# Patient Record
Sex: Male | Born: 1965 | Race: Black or African American | Hispanic: No | Marital: Married | Smoking: Never smoker
Health system: Southern US, Community
[De-identification: ages and names within clinical notes are randomized; demographics above are authoritative.]

---

## 2016-05-27 ENCOUNTER — Emergency Department (HOSPITAL_BASED_OUTPATIENT_CLINIC_OR_DEPARTMENT_OTHER)
Admission: EM | Admit: 2016-05-27 | Discharge: 2016-05-27 | Disposition: A | Discharge status: Home / Self Care | Payer: BLUE CROSS/BLUE SHIELD | Attending: Emergency Medicine | Admitting: Emergency Medicine

## 2016-05-27 ENCOUNTER — Emergency Department (HOSPITAL_BASED_OUTPATIENT_CLINIC_OR_DEPARTMENT_OTHER): Payer: BLUE CROSS/BLUE SHIELD

## 2016-05-27 ENCOUNTER — Encounter (HOSPITAL_BASED_OUTPATIENT_CLINIC_OR_DEPARTMENT_OTHER): Payer: Self-pay | Admitting: *Deleted

## 2016-05-27 DIAGNOSIS — R55 Syncope and collapse: Secondary | ICD-10-CM | POA: Diagnosis not present

## 2016-05-27 DIAGNOSIS — J111 Influenza due to unidentified influenza virus with other respiratory manifestations: Secondary | ICD-10-CM

## 2016-05-27 DIAGNOSIS — R05 Cough: Secondary | ICD-10-CM | POA: Diagnosis not present

## 2016-05-27 DIAGNOSIS — R0989 Other specified symptoms and signs involving the circulatory and respiratory systems: Secondary | ICD-10-CM | POA: Diagnosis not present

## 2016-05-27 DIAGNOSIS — Z87891 Personal history of nicotine dependence: Secondary | ICD-10-CM | POA: Insufficient documentation

## 2016-05-27 DIAGNOSIS — R0981 Nasal congestion: Secondary | ICD-10-CM | POA: Diagnosis present

## 2016-05-27 DIAGNOSIS — R69 Illness, unspecified: Secondary | ICD-10-CM

## 2016-05-27 NOTE — ED Notes (Signed)
Pt on cardiac monitor, pulse ox and automatic VS

## 2016-05-27 NOTE — ED Provider Notes (Signed)
[facility]   CSN: [id number] Arrival date & time: 05/27/16  1004     History   Chief Complaint No chief complaint on file.  Chief complaint sinus congestion, cough HPI Scott Irwin is a 51 y.o. male.  HPI Complains of  congestion in his face and cough productive of slight amount of white sputum accompanied by aunt. Chest pain which is caused by coughing onset 3 days ago. Maximum temperature 100. Denies any shortness of breath. Treated himself with Tylenol, Mucinex and Sudafed, without relief. No other associated symptoms. Nothing makes symptoms better or worse. No past medical history on file. Past medical history negative There are no active problems to display for this patient.   No past surgical history on file.     Home Medications    Prior to Admission medications   Not on File    Family History No family history on file.  Social History Social History  Substance Use Topics  . Smoking status: Not on file  . Smokeless tobacco: Not on file  . Alcohol use Not on file   Ex-smoker quit 8 months ago no illicit drug use. Drinks 3 or 4  40 ounce beers per day  Allergies   Patient has no allergy information on record.   Review of Systems Review of Systems  Constitutional: Negative.   HENT: Positive for sinus pressure.   Respiratory: Positive for cough.   Cardiovascular: Negative.        Syncope  Gastrointestinal: Negative.   Musculoskeletal: Negative.   Skin: Negative.   Allergic/Immunologic: Negative.   Neurological: Negative.   Psychiatric/Behavioral: Negative.   All other systems reviewed and are negative.    Physical Exam Updated Vital Signs BP (!) 148/109 (BP Location: Right Arm)   Pulse 82   Temp 98.2 F (36.8 C) (Oral)   Resp 18   Ht 5\' 7"  (1.702 m)   Wt 175 lb (79.4 kg)   SpO2 100%   BMI 27.41 kg/m   Physical Exam  Constitutional: He appears well-developed and well-nourished.  HENT:  Head: Normocephalic  and atraumatic.  Eyes: Conjunctivae are normal. Pupils are equal, round, and reactive to light.  Neck: Neck supple. No tracheal deviation present. No thyromegaly present.  Cardiovascular: Normal rate and regular rhythm.   No murmur heard. Pulmonary/Chest: Effort normal. No respiratory distress.  Coughing. Scant diffuse rhonchi  Abdominal: Soft. Bowel sounds are normal. He exhibits no distension. There is no tenderness.  Musculoskeletal: Normal range of motion. He exhibits no edema or tenderness.  Neurological: He is alert. Coordination normal.  Skin: Skin is warm and dry. No rash noted.  Psychiatric: He has a normal mood and affect.  Nursing note and vitals reviewed.    ED Treatments / Results  Labs (all labs ordered are listed, but only abnormal results are displayed) Labs Reviewed - No data to display  EKG  EKG Interpretation  Date/Time:  Saturday May 27 2016 10:15:09 EST Ventricular Rate:  91 PR Interval:    QRS Duration: 81 QT Interval:  340 QTC Calculation: 419 R Axis:   36 Text Interpretation:  Sinus rhythm Probable left atrial enlargement Abnormal R-wave progression, early transition No old tracing to compare Confirmed by Ethelda ChickJACUBOWITZ  MD, Jonette Wassel [phone]([id number]) on 05/27/2016 10:22:46 AM       Radiology No results found.  Procedures Procedures (including critical care time)  Medications Ordered in ED Medications - No data to display   Initial Impression / Assessment and Plan /  ED Course  I have reviewed the triage vital signs and the nursing notes.  Pertinent labs & imaging results that were available during my care of the patient were reviewed by me and considered in my medical decision making (see chart for details).  Clinical Course     Chest x-ray viewed by me. No results found for this or any previous visit. Dg Chest 2 View  Result Date: 05/27/2016 CLINICAL DATA:  Cough for 4 days.  Chest pain today. EXAM: CHEST  2 VIEW COMPARISON:  None. FINDINGS: Lungs are  clear. Heart size is normal. No pneumothorax or pleural fluid. No bony abnormality. IMPRESSION: Negative chest. Electronically Signed   By: Drusilla Kanner M.D.   On: 05/27/2016 10:49   Symptoms and exam consistent with influenza-like illness. Patient with incidental elevated blood pressure Final Clinical Impressions(s) / ED Diagnoses  Plan Tylenol for aches. Blood pressure recheck 2-3 weeks. Referral for primary care physician. Final diagnoses:  None   Diagnoses #1 influenza-like illness #2 elevated blood pressure New Prescriptions New Prescriptions   No medications on file     Doug Sou, MD 05/27/16 1104

## 2016-05-27 NOTE — Discharge Instructions (Signed)
Take Tylenol every 4 hours for aches or for temperature higher than 100.4. Take Robitussin as record for cough. See an urgent care if not better in a week or return if condition worsens for any reason or if concerned. Call the 800 number on your discharge instructions to arrange to get a primary care physician. Your blood pressure should be rechecked within the next one or two weeks. Today's was elevated at 148/109.

## 2018-01-22 IMAGING — DX DG CHEST 2V
2 series · 2 of 2 positions shown · non-contrast
Comparison: None.

CLINICAL DATA: Cough for 4 days.  Chest pain today.

EXAM:
CHEST  2 VIEW

[chest pa]
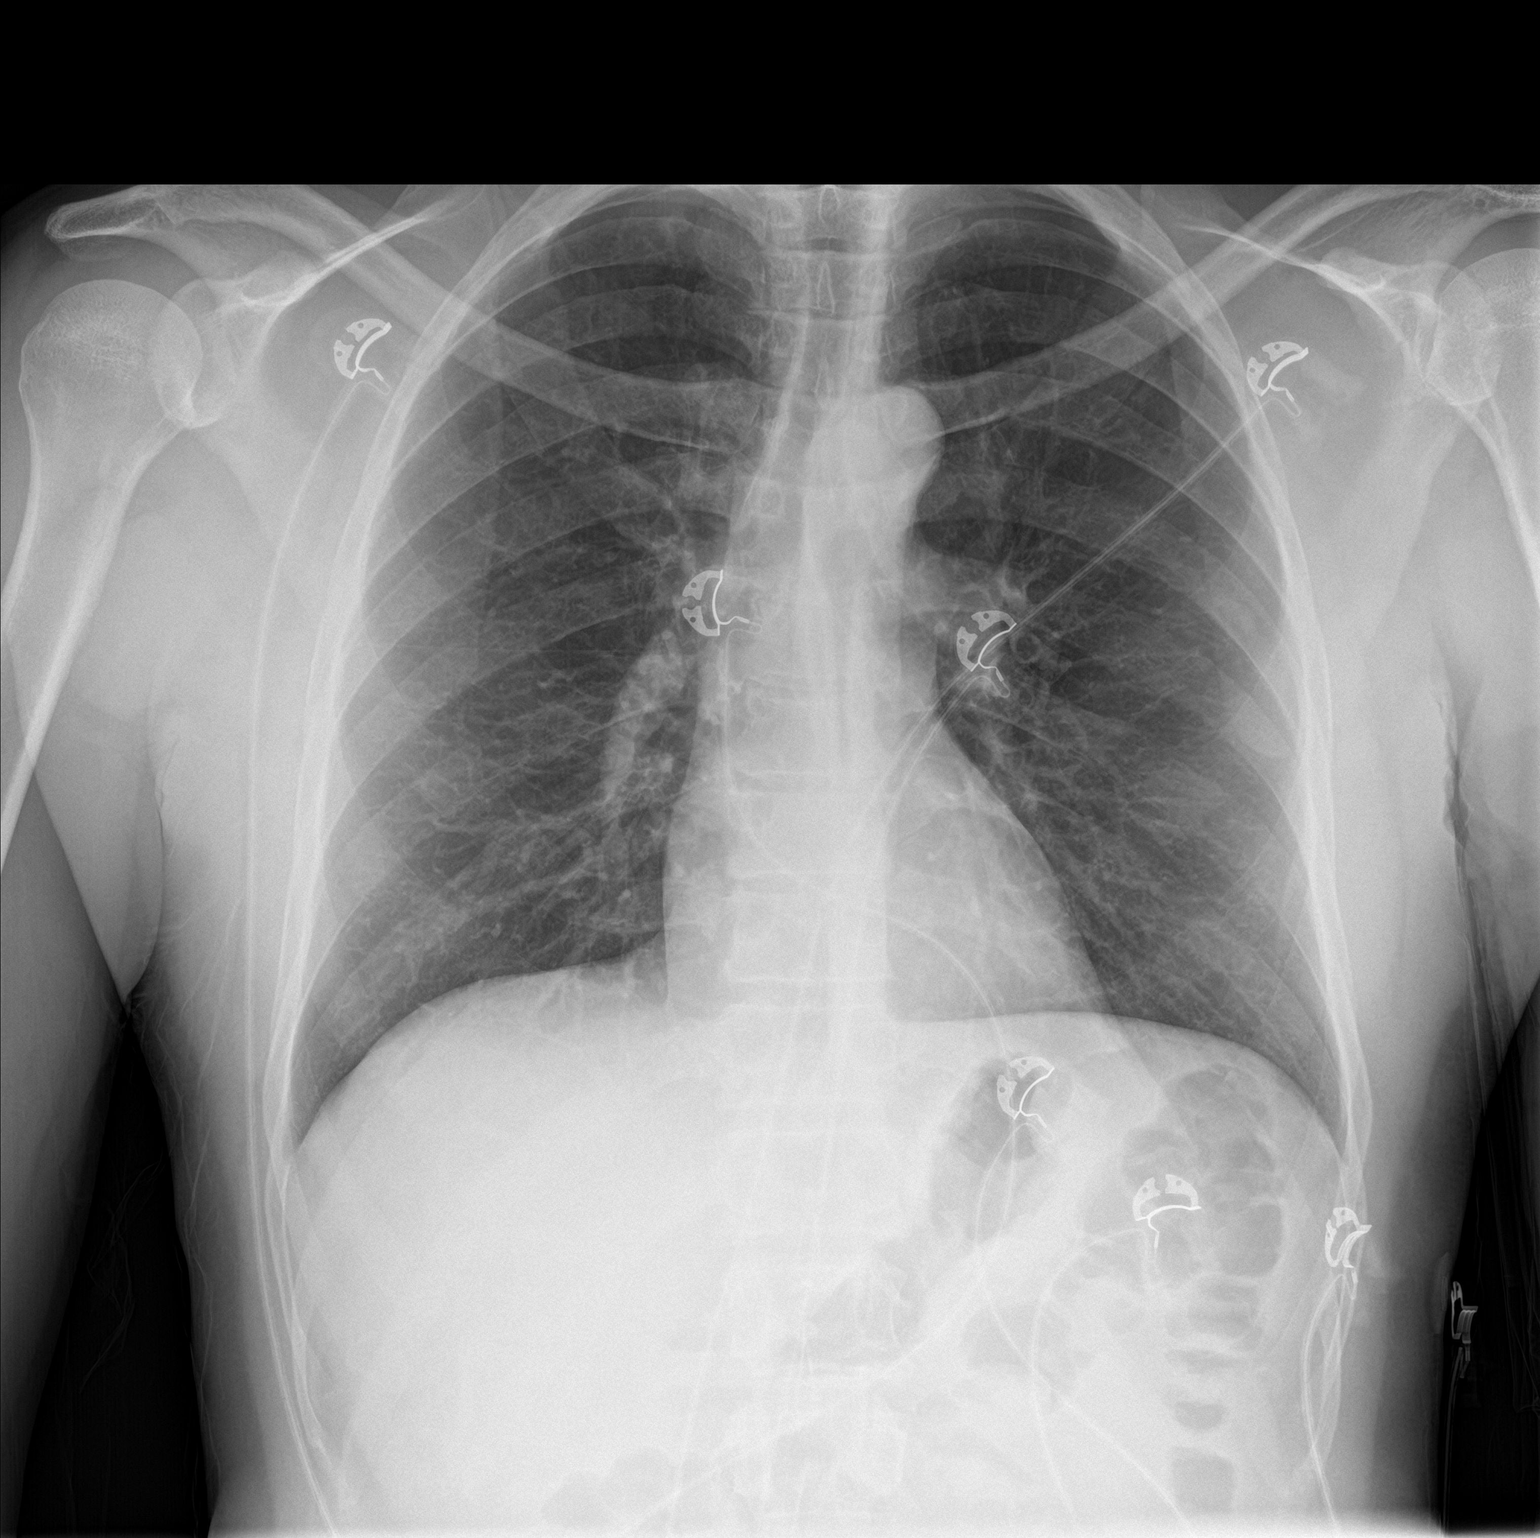

[chest lat]
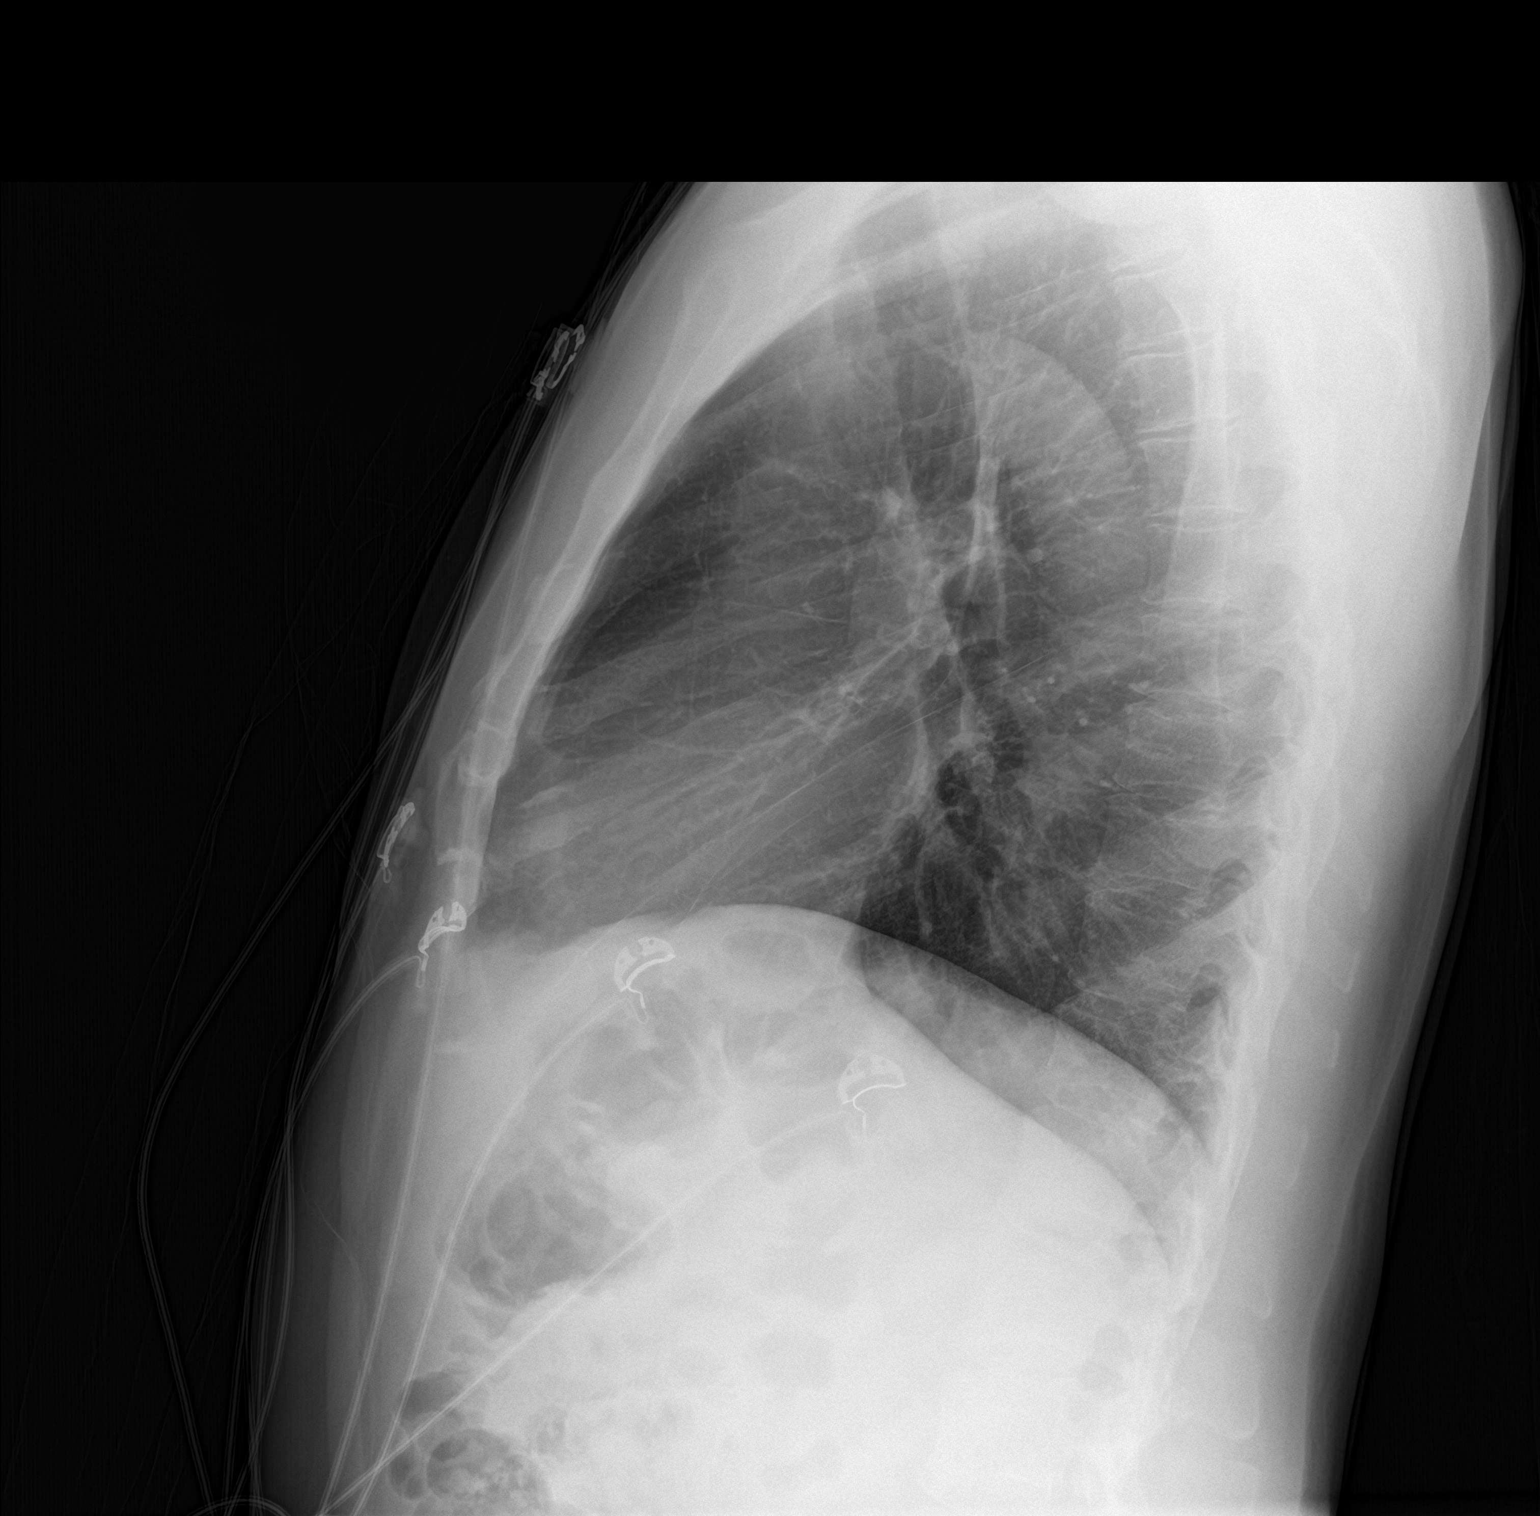

[2 of 2 positions shown; findings below may reference images not displayed]

FINDINGS: Lungs are clear. Heart size is normal. No pneumothorax or pleural
fluid. No bony abnormality.
IMPRESSION: Negative chest.
